# Patient Record
Sex: Female | Born: 1985 | Race: White | Hispanic: No | Marital: Single | Smoking: Never smoker
Health system: Southern US, Community
[De-identification: ages and names within clinical notes are randomized; demographics above are authoritative.]

## PROBLEM LIST (undated history)

## (undated) DIAGNOSIS — E039 Hypothyroidism, unspecified: Secondary | ICD-10-CM

## (undated) DIAGNOSIS — J45909 Unspecified asthma, uncomplicated: Secondary | ICD-10-CM

## (undated) DIAGNOSIS — C73 Malignant neoplasm of thyroid gland: Secondary | ICD-10-CM

## (undated) DIAGNOSIS — G43909 Migraine, unspecified, not intractable, without status migrainosus: Secondary | ICD-10-CM

## (undated) HISTORY — DX: Hypothyroidism, unspecified: E03.9

## (undated) HISTORY — DX: Migraine, unspecified, not intractable, without status migrainosus: G43.909

## (undated) HISTORY — DX: Unspecified asthma, uncomplicated: J45.909

## (undated) HISTORY — DX: Malignant neoplasm of thyroid gland: C73

---

## 2005-06-23 DIAGNOSIS — C73 Malignant neoplasm of thyroid gland: Secondary | ICD-10-CM

## 2005-06-23 HISTORY — DX: Malignant neoplasm of thyroid gland: C73

## 2005-06-23 HISTORY — PX: THYROIDECTOMY: SHX17

## 2013-05-31 ENCOUNTER — Other Ambulatory Visit: Payer: Self-pay | Admitting: Chiropractic Medicine

## 2013-05-31 ENCOUNTER — Ambulatory Visit
Admission: RE | Admit: 2013-05-31 | Discharge: 2013-05-31 | Disposition: A | Discharge status: Home / Self Care | Payer: PRIVATE HEALTH INSURANCE | Source: Ambulatory Visit | Attending: Chiropractic Medicine | Admitting: Chiropractic Medicine

## 2013-05-31 DIAGNOSIS — M4310 Spondylolisthesis, site unspecified: Secondary | ICD-10-CM

## 2013-06-30 ENCOUNTER — Ambulatory Visit (INDEPENDENT_AMBULATORY_CARE_PROVIDER_SITE_OTHER): Payer: PRIVATE HEALTH INSURANCE | Admitting: Certified Nurse Midwife

## 2013-06-30 ENCOUNTER — Encounter: Payer: Self-pay | Admitting: Certified Nurse Midwife

## 2013-06-30 VITALS — BP 90/64 | HR 68 | Resp 16 | Ht 69.0 in | Wt 164.0 lb

## 2013-06-30 DIAGNOSIS — Z01419 Encounter for gynecological examination (general) (routine) without abnormal findings: Secondary | ICD-10-CM

## 2013-06-30 DIAGNOSIS — Z8669 Personal history of other diseases of the nervous system and sense organs: Secondary | ICD-10-CM

## 2013-06-30 DIAGNOSIS — Z0000 Encounter for general adult medical examination without abnormal findings: Secondary | ICD-10-CM

## 2013-06-30 DIAGNOSIS — Z30430 Encounter for insertion of intrauterine contraceptive device: Secondary | ICD-10-CM

## 2013-06-30 DIAGNOSIS — Z85850 Personal history of malignant neoplasm of thyroid: Secondary | ICD-10-CM

## 2013-06-30 NOTE — Patient Instructions (Signed)
General topics  Next pap or exam is  due in 1 year Take a Women's multivitamin Take 1200 mg. of calcium daily - prefer dietary If any concerns in interim to call back  Breast Self-Awareness Practicing breast self-awareness may pick up problems early, prevent significant medical complications, and possibly save your life. By practicing breast self-awareness, you can become familiar with how your breasts look and feel and if your breasts are changing. This allows you to notice changes early. It can also offer you some reassurance that your breast health is good. One way to learn what is normal for your breasts and whether your breasts are changing is to do a breast self-exam. If you find a lump or something that was not present in the past, it is best to contact your caregiver right away. Other findings that should be evaluated by your caregiver include nipple discharge, especially if it is bloody; skin changes or reddening; areas where the skin seems to be pulled in (retracted); or new lumps and bumps. Breast pain is seldom associated with cancer (malignancy), but should also be evaluated by a caregiver. BREAST SELF-EXAM The best time to examine your breasts is 5 7 days after your menstrual period is over.  ExitCare Patient Information 2013 ExitCare, LLC.   Exercise to Stay Healthy Exercise helps you become and stay healthy. EXERCISE IDEAS AND TIPS Choose exercises that:  You enjoy.  Fit into your day. You do not need to exercise really hard to be healthy. You can do exercises at a slow or medium level and stay healthy. You can:  Stretch before and after working out.  Try yoga, Pilates, or tai chi.  Lift weights.  Walk fast, swim, jog, run, climb stairs, bicycle, dance, or rollerskate.  Take aerobic classes. Exercises that burn about 150 calories:  Running 1  miles in 15 minutes.  Playing volleyball for 45 to 60 minutes.  Washing and waxing a car for 45 to 60  minutes.  Playing touch football for 45 minutes.  Walking 1  miles in 35 minutes.  Pushing a stroller 1  miles in 30 minutes.  Playing basketball for 30 minutes.  Raking leaves for 30 minutes.  Bicycling 5 miles in 30 minutes.  Walking 2 miles in 30 minutes.  Dancing for 30 minutes.  Shoveling snow for 15 minutes.  Swimming laps for 20 minutes.  Walking up stairs for 15 minutes.  Bicycling 4 miles in 15 minutes.  Gardening for 30 to 45 minutes.  Jumping rope for 15 minutes.  Washing windows or floors for 45 to 60 minutes. Document Released: 07/12/2010 Document Revised: 09/01/2011 Document Reviewed: 07/12/2010 ExitCare Patient Information 2013 ExitCare[employer].   Other topics ( that may be useful information):    Sexually Transmitted Disease Sexually transmitted disease (STD) refers to any infection that is passed from person to person during sexual activity. This may happen by way of saliva, semen, blood, vaginal mucus, or urine. Common STDs include:  Gonorrhea.  Chlamydia.  Syphilis.  HIV/AIDS.  Genital herpes.  Hepatitis B and C.  Trichomonas.  Human papillomavirus (HPV).  Pubic lice. CAUSES  An STD may be spread by bacteria, virus, or parasite. A person can get an STD by:  Sexual intercourse with an infected person.  Sharing sex toys with an infected person.  Sharing needles with an infected person.  Having intimate contact with the genitals, mouth, or rectal areas of an infected person. SYMPTOMS  Some people may not have any symptoms, but   they can still pass the infection to others. Different STDs have different symptoms. Symptoms include:  Painful or bloody urination.  Pain in the pelvis, abdomen, vagina, anus, throat, or eyes.  Skin rash, itching, irritation, growths, or sores (lesions). These usually occur in the genital or anal area.  Abnormal vaginal discharge.  Penile discharge in men.  Soft, flesh-colored skin growths in the  genital or anal area.  Fever.  Pain or bleeding during sexual intercourse.  Swollen glands in the groin area.  Yellow skin and eyes (jaundice). This is seen with hepatitis. DIAGNOSIS  To make a diagnosis, your caregiver may:  Take a medical history.  Perform a physical exam.  Take a specimen (culture) to be examined.  Examine a sample of discharge under a microscope.  Perform blood test TREATMENT   Chlamydia, gonorrhea, trichomonas, and syphilis can be cured with antibiotic medicine.  Genital herpes, hepatitis, and HIV can be treated, but not cured, with prescribed medicines. The medicines will lessen the symptoms.  Genital warts from HPV can be treated with medicine or by freezing, burning (electrocautery), or surgery. Warts may come back.  HPV is a virus and cannot be cured with medicine or surgery.However, abnormal areas may be followed very closely by your caregiver and may be removed from the cervix, vagina, or vulva through office procedures or surgery. If your diagnosis is confirmed, your recent sexual partners need treatment. This is true even if they are symptom-free or have a negative culture or evaluation. They should not have sex until their caregiver says it is okay. HOME CARE INSTRUCTIONS  All sexual partners should be informed, tested, and treated for all STDs.  Take your antibiotics as directed. Finish them even if you start to feel better.  Only take over-the-counter or prescription medicines for pain, discomfort, or fever as directed by your caregiver.  Rest.  Eat a balanced diet and drink enough fluids to keep your urine clear or pale yellow.  Do not have sex until treatment is completed and you have followed up with your caregiver. STDs should be checked after treatment.  Keep all follow-up appointments, Pap tests, and blood tests as directed by your caregiver.  Only use latex condoms and water-soluble lubricants during sexual activity. Do not use  petroleum jelly or oils.  Avoid alcohol and illegal drugs.  Get vaccinated for HPV and hepatitis. If you have not received these vaccines in the past, talk to your caregiver about whether one or both might be right for you.  Avoid risky sex practices that can break the skin. The only way to avoid getting an STD is to avoid all sexual activity.Latex condoms and dental dams (for oral sex) will help lessen the risk of getting an STD, but will not completely eliminate the risk. SEEK MEDICAL CARE IF:   You have a fever.  You have any new or worsening symptoms. Document Released: 08/30/2002 Document Revised: 09/01/2011 Document Reviewed: 09/06/2010 [facility] Patient Information 2013 [employer].    Domestic Abuse You are being battered or abused if someone close to you hits, pushes, or physically hurts you in any way. You also are being abused if you are forced into activities. You are being sexually abused if you are forced to have sexual contact of any kind. You are being emotionally abused if you are made to feel worthless or if you are constantly threatened. It is important to remember that help is available. No one has the right to abuse you. PREVENTION OF FURTHER  ABUSE  Learn the warning signs of danger. This varies with situations but may include: the use of alcohol, threats, isolation from friends and family, or forced sexual contact. Leave if you feel that violence is going to occur.  If you are attacked or beaten, report it to the police so the abuse is documented. You do not have to press charges. The police can protect you while you or the attackers are leaving. Get the officer's name and badge number and a copy of the report.  Find someone you can trust and tell them what is happening to you: your caregiver, a nurse, clergy member, close friend or family member. Feeling ashamed is natural, but remember that you have done nothing wrong. No one deserves abuse. Document Released:  06/06/2000 Document Revised: 09/01/2011 Document Reviewed: 08/15/2010 ExitCare Patient Information 2013 ExitCare[employer].    How Much is Too Much Alcohol? Drinking too much alcohol can cause injury, accidents, and health problems. These types of problems can include:   Car crashes.  Falls.  Family fighting (domestic violence).  Drowning.  Fights.  Injuries.  Burns.  Damage to certain organs.  Having a baby with birth defects. ONE DRINK CAN BE TOO MUCH WHEN YOU ARE:  Working.  Pregnant or breastfeeding.  Taking medicines. Ask your doctor.  Driving or planning to drive. If you or someone you know has a drinking problem, get help from a doctor.  Document Released: 04/05/2009 Document Revised: 09/01/2011 Document Reviewed: 04/05/2009 ExitCare Patient Information 2013 ExitCare[employer].   Smoking Hazards Smoking cigarettes is extremely bad for your health. Tobacco smoke has over 200 known poisons in it. There are over 60 chemicals in tobacco smoke that cause cancer. Some of the chemicals found in cigarette smoke include:   Cyanide.  Benzene.  Formaldehyde.  Methanol (wood alcohol).  Acetylene (fuel used in welding torches).  Ammonia. Cigarette smoke also contains the poisonous gases nitrogen oxide and carbon monoxide.  Cigarette smokers have an increased risk of many serious medical problems and Smoking causes approximately:  90% of all lung cancer deaths in men.  80% of all lung cancer deaths in women.  90% of deaths from chronic obstructive lung disease. Compared with nonsmokers, smoking increases the risk of:  Coronary heart disease by 2 to 4 times.  Stroke by 2 to 4 times.  Men developing lung cancer by 23 times.  Women developing lung cancer by 13 times.  Dying from chronic obstructive lung diseases by 12 times.  . Smoking is the most preventable cause of death and disease in our society.  WHY IS SMOKING ADDICTIVE?  Nicotine is the chemical  agent in tobacco that is capable of causing addiction or dependence.  When you smoke and inhale, nicotine is absorbed rapidly into the bloodstream through your lungs. Nicotine absorbed through the lungs is capable of creating a powerful addiction. Both inhaled and non-inhaled nicotine may be addictive.  Addiction studies of cigarettes and spit tobacco show that addiction to nicotine occurs mainly during the teen years, when young people begin using tobacco products. WHAT ARE THE BENEFITS OF QUITTING?  There are many health benefits to quitting smoking.   Likelihood of developing cancer and heart disease decreases. Health improvements are seen almost immediately.  Blood pressure, pulse rate, and breathing patterns start returning to normal soon after quitting. QUITTING SMOKING   American Lung Association - 1-800-LUNGUSA  American Cancer Society - 1-800-ACS-2345 Document Released: 07/17/2004 Document Revised: 09/01/2011 Document Reviewed: 03/21/2009 ExitCare Patient Information 2013 ExitCare,   LLC.   Stress Management Stress is a state of physical or mental tension that often results from changes in your life or normal routine. Some common causes of stress are:  Death of a loved one.  Injuries or severe illnesses.  Getting fired or changing jobs.  Moving into a new home. Other causes may be:  Sexual problems.  Business or financial losses.  Taking on a large debt.  Regular conflict with someone at home or at work.  Constant tiredness from lack of sleep. It is not just bad things that are stressful. It may be stressful to:  Win the lottery.  Get married.  Buy a new car. The amount of stress that can be easily tolerated varies from person to person. Changes generally cause stress, regardless of the types of change. Too much stress can affect your health. It may lead to physical or emotional problems. Too little stress (boredom) may also become stressful. SUGGESTIONS TO  REDUCE STRESS:  Talk things over with your family and friends. It often is helpful to share your concerns and worries. If you feel your problem is serious, you may want to get help from a professional counselor.  Consider your problems one at a time instead of lumping them all together. Trying to take care of everything at once may seem impossible. List all the things you need to do and then start with the most important one. Set a goal to accomplish 2 or 3 things each day. If you expect to do too many in a single day you will naturally fail, causing you to feel even more stressed.  Do not use alcohol or drugs to relieve stress. Although you may feel better for a short time, they do not remove the problems that caused the stress. They can also be habit forming.  Exercise regularly - at least 3 times per week. Physical exercise can help to relieve that "uptight" feeling and will relax you.  The shortest distance between despair and hope is often a good night's sleep.  Go to bed and get up on time allowing yourself time for appointments without being rushed.  Take a short "time-out" period from any stressful situation that occurs during the day. Close your eyes and take some deep breaths. Starting with the muscles in your face, tense them, hold it for a few seconds, then relax. Repeat this with the muscles in your neck, shoulders, hand, stomach, back and legs.  Take good care of yourself. Eat a balanced diet and get plenty of rest.  Schedule time for having fun. Take a break from your daily routine to relax. HOME CARE INSTRUCTIONS   Call if you feel overwhelmed by your problems and feel you can no longer manage them on your own.  Return immediately if you feel like hurting yourself or someone else. Document Released: 12/03/2000 Document Revised: 09/01/2011 Document Reviewed: 07/26/2007 ExitCare Patient Information 2013 ExitCare, LLC.   

## 2013-06-30 NOTE — Progress Notes (Signed)
28 y.o. G0P0000 Single Caucasian Fe here to establish gyn care and  for annual exam. Periods normal, no issue. Contraception currently none. Not sexually active in past months. Previous OCP use with [facility] without problems. History of Migraine headache with aura. Patient interested in [place] IUD due to unpredictable schedule. Patient PA with [facility] in ER. Does not plan to be sexually active in up coming months. Desires GC,Chlamydia screen today. Sees PCP for aex, medication management of  Hypothyroid due to thyroid removal for cancer at age 58.  No other health issues today.   Patient's last menstrual period was 06/12/2013.          Sexually active: no  The current method of family planning is abstinence.    Exercising: yes  crossfit Smoker:  no  Health Maintenance: Pap:  2013 normal  Per patient  No abnormal pap smears MMG:  none Colonoscopy:  none BMD:   2008 TDaP: 2011? Labs: none Self breast exam: done occ   reports that she has never smoked. She has never used smokeless tobacco. She reports that she drinks about 2.0 ounces of alcohol per week. She reports that she does not use illicit drugs.  Past Medical History  Diagnosis Date  . Migraines     with aura  . Asthma   . Cancer     thyroid  . Thyroid disease     thyroid cancer hypo    Past Surgical History  Procedure Laterality Date  . Thyroidectomy  2007    Current Outpatient Prescriptions  Medication Sig Dispense Refill  . ALBUTEROL IN Inhale into the lungs as needed.      Marland Kitchen levothyroxine (SYNTHROID, LEVOTHROID) 200 MCG tablet Take 200 mcg by mouth daily before breakfast.       No current facility-administered medications for this visit.    Family History  Problem Relation Age of Onset  . Hypertension Father   . Cancer Maternal Grandmother     gallbladder  . Thyroid disease Maternal Grandmother     graves  . Cancer Paternal Grandmother     throat?    ROS:  Pertinent items are noted in HPI.  Otherwise,  a comprehensive ROS was negative.  Exam:   BP 90/64  Pulse 68  Resp 16  Ht 5\' 9"  (1.753 m)  Wt 164 lb (74.39 kg)  BMI 24.21 kg/m2  LMP 06/12/2013 Height: 5\' 9"  (175.3 cm)  Ht Readings from Last 3 Encounters:  06/30/13 5\' 9"  (1.753 m)    General appearance: alert, cooperative and appears stated age Head: Normocephalic, without obvious abnormality, atraumatic Neck: no adenopathy, supple, symmetrical, trachea midline and thyroid absent with no nodules noted Lungs: clear to auscultation bilaterally Breasts: normal appearance, no masses or tenderness, No nipple retraction or dimpling, No nipple discharge or bleeding, No axillary or supraclavicular adenopathy Heart: regular rate and rhythm Abdomen: soft, non-tender; no masses,  no organomegaly Extremities: extremities normal, atraumatic, no cyanosis or edema Skin: Skin color, texture, turgor normal. No rashes or lesions Lymph nodes: Cervical, supraclavicular, and axillary nodes normal. No abnormal inguinal nodes palpated Neurologic: Grossly normal   Pelvic: External genitalia:  no lesions              Urethra:  normal appearing urethra with no masses, tenderness or lesions              Bartholin's and Skene's: normal                 Vagina:  normal appearing vagina with normal color and discharge, no lesions              Cervix: normal non tender nullparous os              Pap taken: yes Bimanual Exam:  Uterus:  normal size, contour, position, consistency, mobility, non-tender and anteverted              Adnexa: normal adnexa and no mass, fullness, tenderness               Rectovaginal: Confirms               Anus:  deferred  A:  Well Woman with normal exam  Contraceptive change desired, Mirena or Skyla IUD  History of thyroid cancer at age 65 with Thyroidectomy, on medication for Hypothyroid with PCP management  History of Migraine with aura with previous OCP use  STD screen  P:   Reviewed health and wellness pertinent to  exam  Given information on IUD as above, discussed insertion, bleeding profile, use of cytotec prior to insertion, must be on menses, and will do UPT prior to insertion. If becomes sexually active will need consistent condom use Questions addressed at length, patient would like to schedule, but not sure which IUD will decide. Given information for insurance call regarding cost.  Continue follow up as indicated  Discussed concern with OCP and migraine aura history and contraindication  Lab GC,Chlamydia  Pap smear as per guidelines   pap smear counseled on breast self exam, STD prevention, HIV risk factors and prevention, adequate intake of calcium and vitamin D, diet and exercise  return annually or prn  An After Visit Summary was printed and given to the patient.

## 2013-07-01 LAB — IPS PAP TEST WITH REFLEX TO HPV

## 2013-07-02 LAB — IPS N GONORRHOEA AND CHLAMYDIA BY PCR

## 2013-07-04 ENCOUNTER — Telehealth: Payer: Self-pay | Admitting: Gynecology

## 2013-07-04 NOTE — Progress Notes (Signed)
Reviewed personally.  M. Suzanne Catrina Fellenz, MD.  

## 2013-07-04 NOTE — Telephone Encounter (Signed)
Voicemail confirmed mobile #/ left message for patient to schedule iud insertion within the first 5 days of cycle/advised that per insurance she will have 0 liability//ssf

## 2013-08-02 ENCOUNTER — Telehealth: Payer: Self-pay | Admitting: Certified Nurse Midwife

## 2013-08-02 MED ORDER — MISOPROSTOL 200 MCG PO TABS: ORAL_TABLET | ORAL | Status: DC

## 2013-08-02 NOTE — Telephone Encounter (Signed)
Patient was told to call when she started her cycle for [facility] insertion.

## 2013-08-02 NOTE — Telephone Encounter (Signed)
Started menses 08/01/13. G0P0 patient of Regina Eck CNM Patient scheduled for IUD (patient requests Skyla) insertion on Friday 08/05/13 with Dr. Sabra Heck.  Cytotec instructions given and sent to pharmacy of choice. Take one tablet the night before procedure and one tablet the morning of procedure.  Motrin instructions given.  Motrin=Advil=Ibuprofen 800 mg one hour before procedure. Eat a meal and hydrate well before appointment.   Routing to provider for final review. Patient agreeable to disposition. Will close encounter

## 2013-08-05 ENCOUNTER — Encounter: Payer: Self-pay | Admitting: Obstetrics & Gynecology

## 2013-08-05 ENCOUNTER — Ambulatory Visit (INDEPENDENT_AMBULATORY_CARE_PROVIDER_SITE_OTHER): Payer: PRIVATE HEALTH INSURANCE | Admitting: Obstetrics & Gynecology

## 2013-08-05 VITALS — BP 94/62 | HR 60 | Ht 69.0 in | Wt 172.0 lb

## 2013-08-05 DIAGNOSIS — Z30430 Encounter for insertion of intrauterine contraceptive device: Secondary | ICD-10-CM

## 2013-08-05 MED ORDER — ONDANSETRON 4 MG PO TBDP: 4.0000 mg | ORAL_TABLET | Freq: Three times a day (TID) | ORAL | Status: AC | PRN

## 2013-08-05 NOTE — Addendum Note (Signed)
Addended by: Megan Salon on: 08/05/2013 10:14 AM   Modules accepted: Orders

## 2013-08-05 NOTE — Patient Instructions (Signed)

## 2013-08-05 NOTE — Progress Notes (Signed)
Patient ID: Heather Stuart, female   DOB: Jun 21, 1986, 28 y.o.   MRN: [id number]  27 yrs Single Caucasianfemale presents for insertion of Skyla IUD. Denies any vaginal symptoms or STD concerns.  STD testing with AEX 1/15 neg.  Normal Pap.  Risks and benefits discussed including but not limited to uterine perforation, embedded IUD, increased risk of ectopic pregnancy, DUB.  LMP: 08/01/13 (cycles usually only 2-3 days)  Patient read information regarding IUD insertion.  All questions addressed.    Healthy female:  WNWD WF, NAD Pelvic exam: Vulva: normal female genitalia Vagina:normal vagina, no discharge, exudate, lesion, or erythema Cervix:Non-tender, Negative CMT, no lesions or redness, nulliparous/parous os Uterus:normal shape, position and consistency   Procedure:  Speculum inserted into vagina. Cervix visualized and cleansed with betadine solution X 3. Tenaculum placed on cervix at 12 o'clock position(s).  Uterus sounded to 7 centimeters.  IUD removed from sterile packet and under sterile conditions inserted to fundus of uterus.  Introducer removed without difficulty.  IUD string trimmed to 2 centimeters.  Remainder string given to patient to feel for identification.  Tenaculum removed.  minimal bleeding noted.  Speculum removed.  Uterus palpated normal.  Patient tolerated procedure well.  A: Insertion of Mirena, Lot # [id number], Expiration date 2/17   P:  Instructions and warnings signs given.       IUD identification card given with IUD removal 08/05/2016       Return visit 6 weeks

## 2013-09-08 ENCOUNTER — Ambulatory Visit (INDEPENDENT_AMBULATORY_CARE_PROVIDER_SITE_OTHER): Payer: PRIVATE HEALTH INSURANCE | Admitting: Obstetrics & Gynecology

## 2013-09-08 ENCOUNTER — Encounter: Payer: Self-pay | Admitting: Obstetrics & Gynecology

## 2013-09-08 VITALS — BP 98/60 | HR 80 | Resp 16 | Ht 69.0 in | Wt 162.6 lb

## 2013-09-08 DIAGNOSIS — Z30431 Encounter for routine checking of intrauterine contraceptive device: Secondary | ICD-10-CM

## 2013-09-08 NOTE — Progress Notes (Signed)
Subjective:     Patient ID: Heather Stuart, female   DOB: August 14, 1985, 28 y.o.   MRN: [id number]  HPI 28 yo G0 SWF here for IUD recheck.  On 08/05/13, Skyla IUD was placed.  Didn't spot very much after placement.  LMP was 3/6 but started as spotting which is a little different for her.  Flow was lighter but lasted a little long.  No pain with intercourse.  Had a little cramping with exercise but this is improving every few days.  Not really concerned.  Review of Systems  All other systems reviewed and are negative.       Objective:   Physical Exam  Constitutional: She appears well-developed and well-nourished.  Genitourinary: Vagina normal and uterus normal.  2cm IUD string noted.  Uterus non tender.  No vaginal discharge or odor.       Assessment:     IUD recheck     Plan:     Return for AEX.   Pt reassured about proper placement.

## 2014-02-04 ENCOUNTER — Other Ambulatory Visit: Payer: Self-pay | Admitting: Obstetrics & Gynecology

## 2014-02-21 ENCOUNTER — Other Ambulatory Visit: Payer: Self-pay | Admitting: Orthopedic Surgery

## 2014-02-21 DIAGNOSIS — M25351 Other instability, right hip: Secondary | ICD-10-CM

## 2014-02-21 DIAGNOSIS — M25551 Pain in right hip: Secondary | ICD-10-CM

## 2014-03-06 ENCOUNTER — Ambulatory Visit
Admission: RE | Admit: 2014-03-06 | Discharge: 2014-03-06 | Disposition: A | Discharge status: Home / Self Care | Payer: PRIVATE HEALTH INSURANCE | Source: Ambulatory Visit | Attending: Orthopedic Surgery | Admitting: Orthopedic Surgery

## 2014-03-06 DIAGNOSIS — M25351 Other instability, right hip: Secondary | ICD-10-CM

## 2014-03-06 DIAGNOSIS — M25551 Pain in right hip: Secondary | ICD-10-CM

## 2014-03-06 MED ORDER — IOHEXOL 180 MG/ML  SOLN
15.0000 mL | Freq: Once | INTRAMUSCULAR | Status: AC | PRN
  Administered 2014-03-06: 15 mL via INTRA_ARTICULAR

## 2014-07-28 ENCOUNTER — Ambulatory Visit: Payer: PRIVATE HEALTH INSURANCE | Admitting: Obstetrics & Gynecology

## 2014-08-03 ENCOUNTER — Ambulatory Visit: Payer: PRIVATE HEALTH INSURANCE | Admitting: Certified Nurse Midwife

## 2016-11-10 ENCOUNTER — Other Ambulatory Visit: Payer: Self-pay | Admitting: Family Medicine

## 2016-11-10 DIAGNOSIS — Z30431 Encounter for routine checking of intrauterine contraceptive device: Secondary | ICD-10-CM

## 2016-11-18 ENCOUNTER — Other Ambulatory Visit: Payer: PRIVATE HEALTH INSURANCE

## 2016-11-24 ENCOUNTER — Ambulatory Visit
Admission: RE | Admit: 2016-11-24 | Discharge: 2016-11-24 | Disposition: A | Discharge status: Home / Self Care | Payer: PRIVATE HEALTH INSURANCE | Source: Ambulatory Visit | Attending: Family Medicine | Admitting: Family Medicine

## 2016-11-24 DIAGNOSIS — Z30431 Encounter for routine checking of intrauterine contraceptive device: Secondary | ICD-10-CM

## 2017-12-31 IMAGING — US US TRANSVAGINAL NON-OB
1 series · 14 of 25 positions shown · non-contrast
Comparison: None

CLINICAL DATA: Lost IUD strings.

EXAM:
TRANSABDOMINAL AND TRANSVAGINAL ULTRASOUND OF PELVIS
TECHNIQUE: Both transabdominal and transvaginal ultrasound examinations of the
pelvis were performed. Transabdominal technique was performed for
global imaging of the pelvis including uterus, ovaries, adnexal
regions, and pelvic cul-de-sac. It was necessary to proceed with
endovaginal exam following the transabdominal exam to visualize the
IUD and ovaries.

[Series 1: us transvaginal non-ob · 0.11mm/px · 14 of 75 slices shown]
[im 1/75]
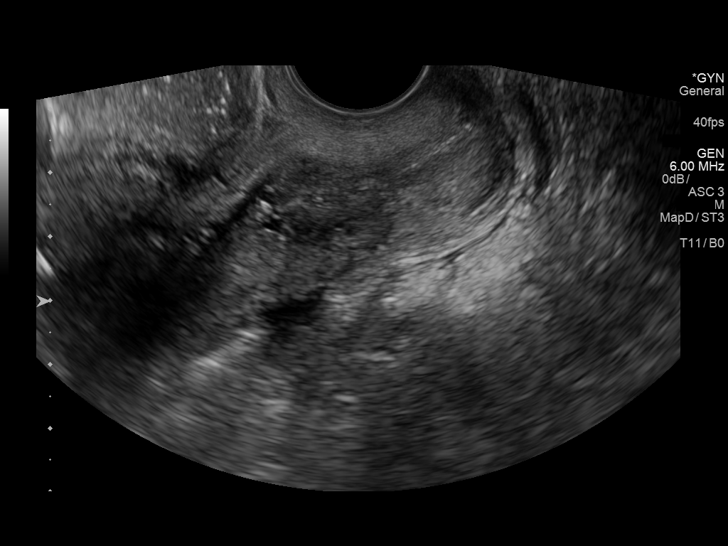
[im 7/75]
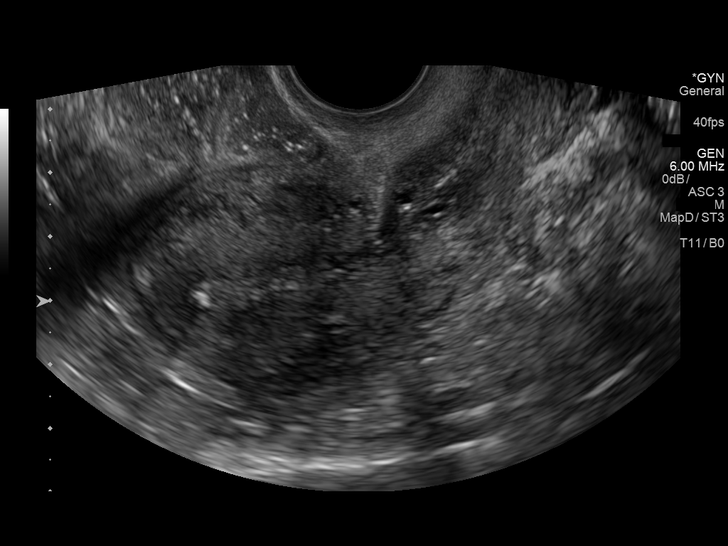
[im 13/75]
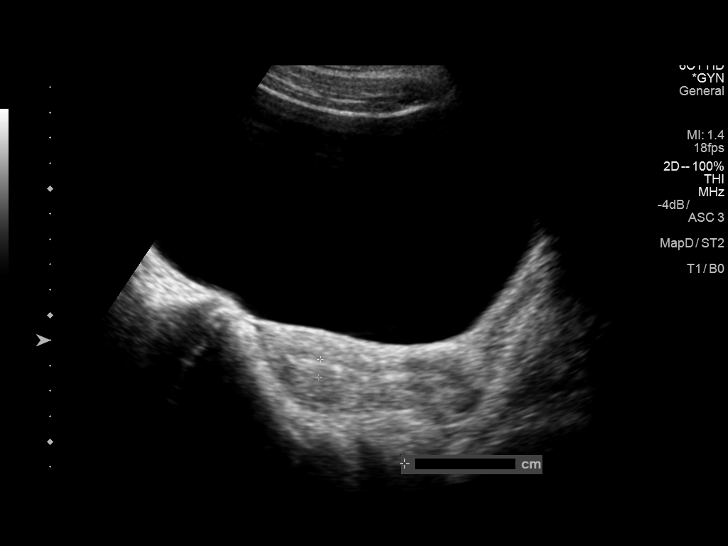
[im 19/75]
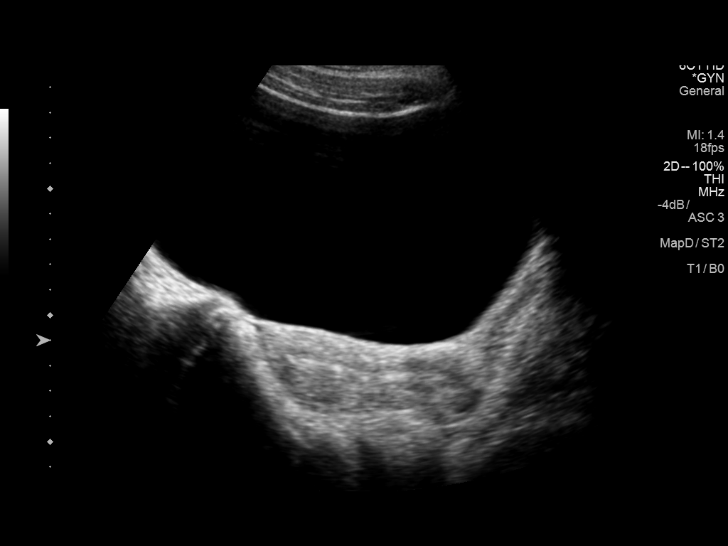
[im 25/75]
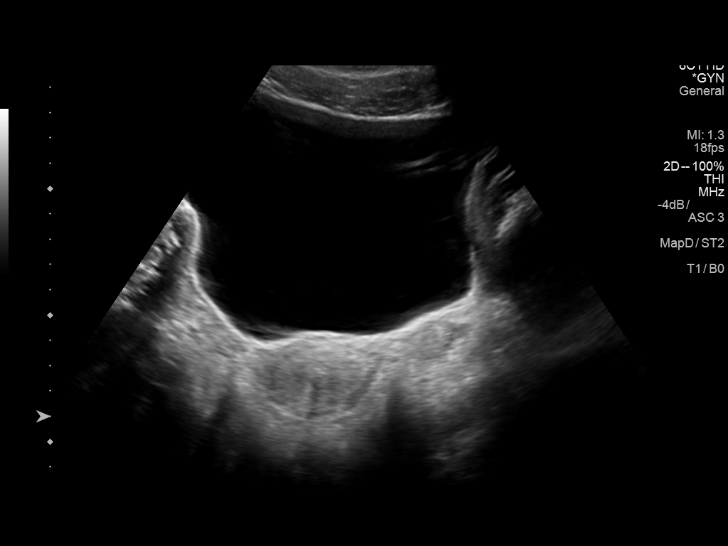
[im 28/75]
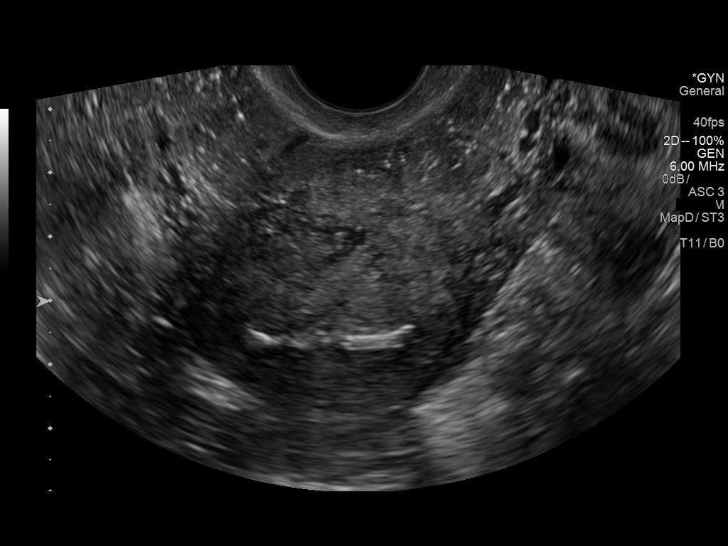
[im 34/75]
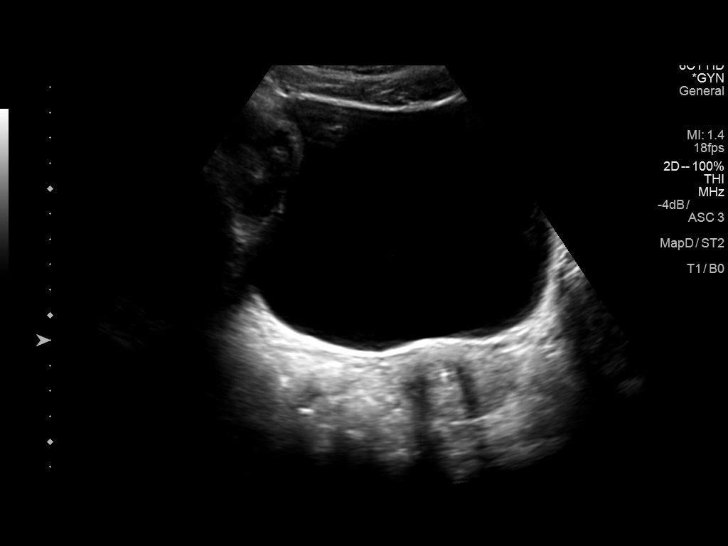
[im 41/75]
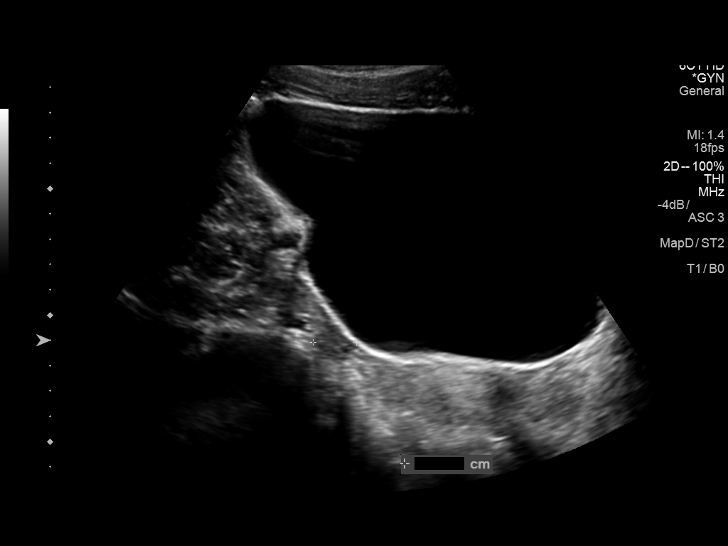
[im 47/75]
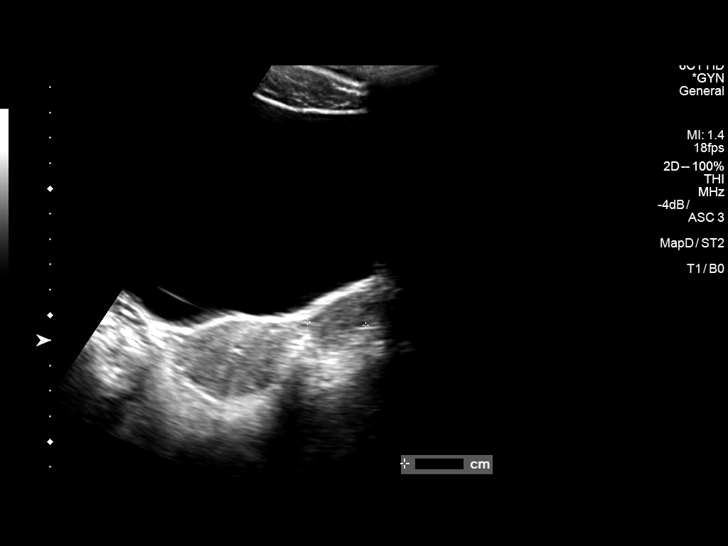
[im 50/75]
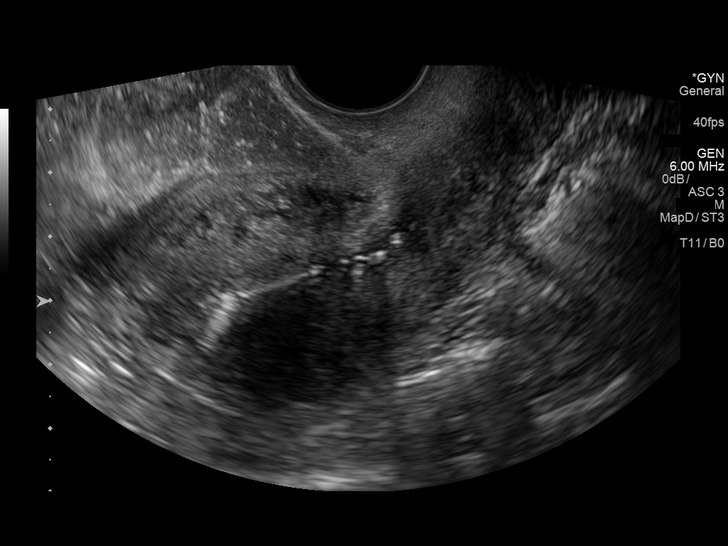
[im 56/75]
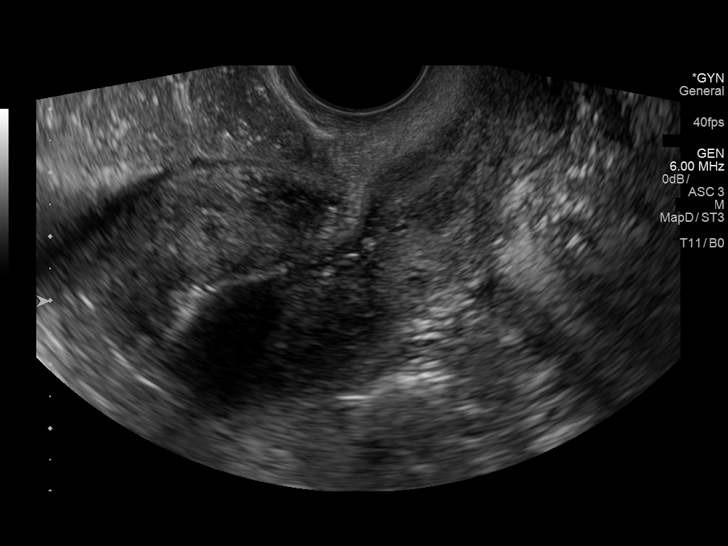
[im 62/75]
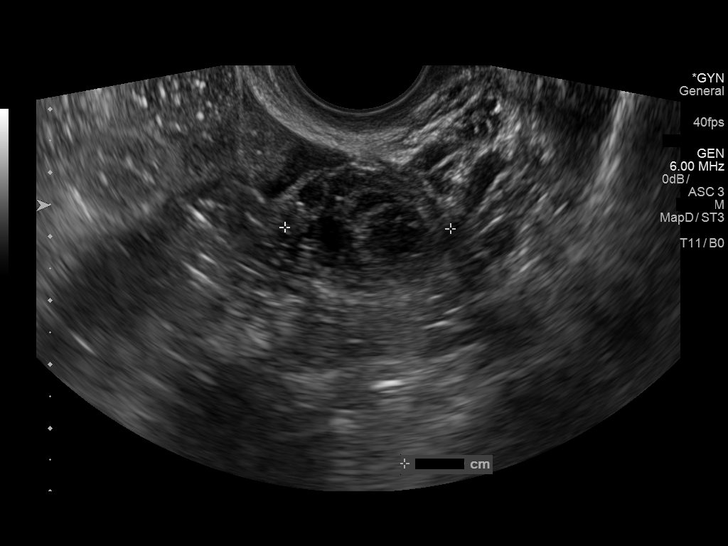
[im 68/75]
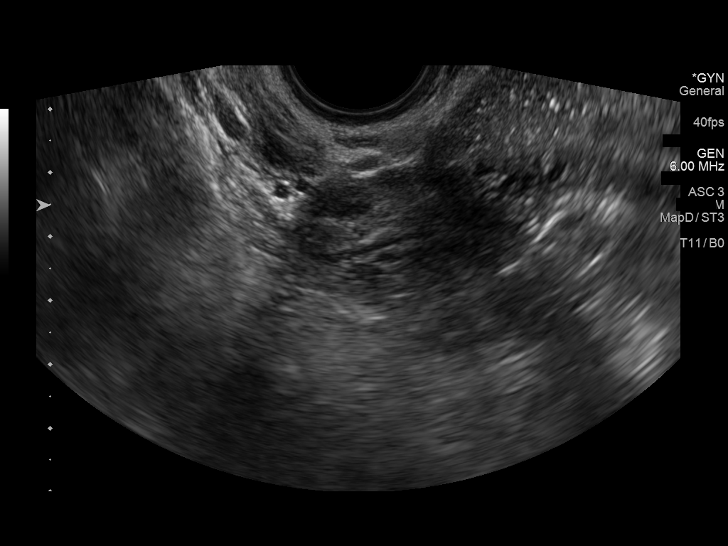
[im 75/75]
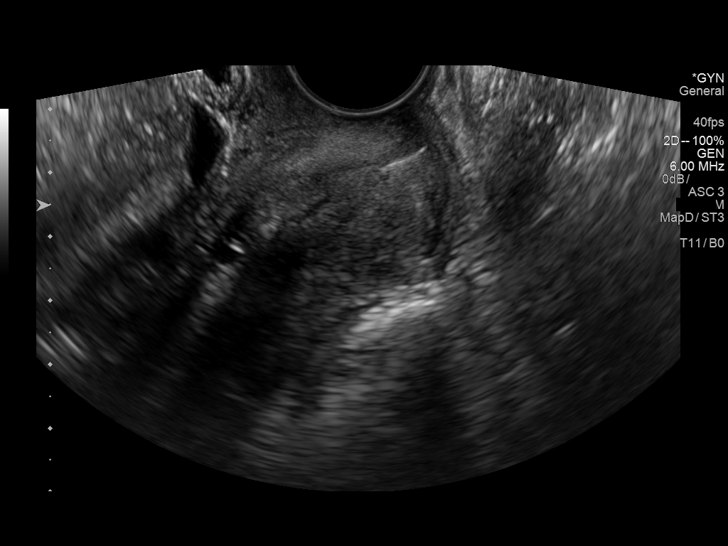

[14 of 25 positions shown; findings below may reference images not displayed]

FINDINGS: Uterus

Measurements: 9.3 x 3.2 x 4.8 cm. No fibroids or other mass
visualized.

Endometrium

Thickness: 7 mm. IUD visualized in appropriate position within the
endometrial cavity..

Right ovary

Measurements: 2.4 x 1.4 x 1.7 cm. Normal appearance/no adnexal mass.

Left ovary

Measurements: 3.3 x 2.3 x 2.6 cm. Small left ovarian corpus luteum
noted. Otherwise normal appearance/no adnexal mass.

Other findings

No abnormal free fluid.
IMPRESSION: IUD visualized within the endometrial cavity.

Small left ovarian corpus luteum incidentally noted.
